# Patient Record
Sex: Female | Born: 1945 | Race: White | Hispanic: No | Marital: Married | Smoking: Never smoker
Health system: Southern US, Community
[De-identification: ages and names within clinical notes are randomized; demographics above are authoritative.]

## PROBLEM LIST (undated history)

## (undated) DIAGNOSIS — E785 Hyperlipidemia, unspecified: Secondary | ICD-10-CM

## (undated) HISTORY — DX: Hyperlipidemia, unspecified: E78.5

---

## 1991-05-03 HISTORY — PX: VAGINAL HYSTERECTOMY: SHX2639

## 1993-02-24 DIAGNOSIS — C4491 Basal cell carcinoma of skin, unspecified: Secondary | ICD-10-CM

## 1993-02-24 HISTORY — DX: Basal cell carcinoma of skin, unspecified: C44.91

## 1995-06-13 DIAGNOSIS — C4491 Basal cell carcinoma of skin, unspecified: Secondary | ICD-10-CM

## 1995-06-13 HISTORY — DX: Basal cell carcinoma of skin, unspecified: C44.91

## 2000-02-08 ENCOUNTER — Other Ambulatory Visit: Admission: RE | Admit: 2000-02-08 | Discharge: 2000-02-08 | Payer: Self-pay | Admitting: Gynecology

## 2004-07-20 DIAGNOSIS — C4492 Squamous cell carcinoma of skin, unspecified: Secondary | ICD-10-CM

## 2004-07-20 DIAGNOSIS — D229 Melanocytic nevi, unspecified: Secondary | ICD-10-CM

## 2004-07-20 DIAGNOSIS — D039 Melanoma in situ, unspecified: Secondary | ICD-10-CM

## 2004-07-20 HISTORY — DX: Squamous cell carcinoma of skin, unspecified: C44.92

## 2004-07-20 HISTORY — DX: Melanocytic nevi, unspecified: D22.9

## 2004-07-20 HISTORY — DX: Melanoma in situ, unspecified: D03.9

## 2004-09-02 ENCOUNTER — Ambulatory Visit (HOSPITAL_COMMUNITY): Admission: RE | Admit: 2004-09-02 | Discharge: 2004-09-02 | Payer: Self-pay | Admitting: Plastic Surgery

## 2004-09-02 ENCOUNTER — Ambulatory Visit (HOSPITAL_BASED_OUTPATIENT_CLINIC_OR_DEPARTMENT_OTHER): Admission: RE | Admit: 2004-09-02 | Discharge: 2004-09-02 | Payer: Self-pay | Admitting: Plastic Surgery

## 2005-12-31 ENCOUNTER — Emergency Department (HOSPITAL_COMMUNITY): Admission: EM | Admit: 2005-12-31 | Discharge: 2005-12-31 | Payer: Self-pay | Admitting: Emergency Medicine

## 2007-05-26 IMAGING — CR DG CHEST 2V
2 series · 2 of 2 positions shown · non-contrast
Comparison: none

CLINICAL DATA: Left chest pain. Cough.  Fever.
 [id number] VIEWS:
 Two views of the chest show patchy pneumonia within the lingula.  The remainder of the lungs are clear.  The heart is within normal limits and size.

[w chest pa]
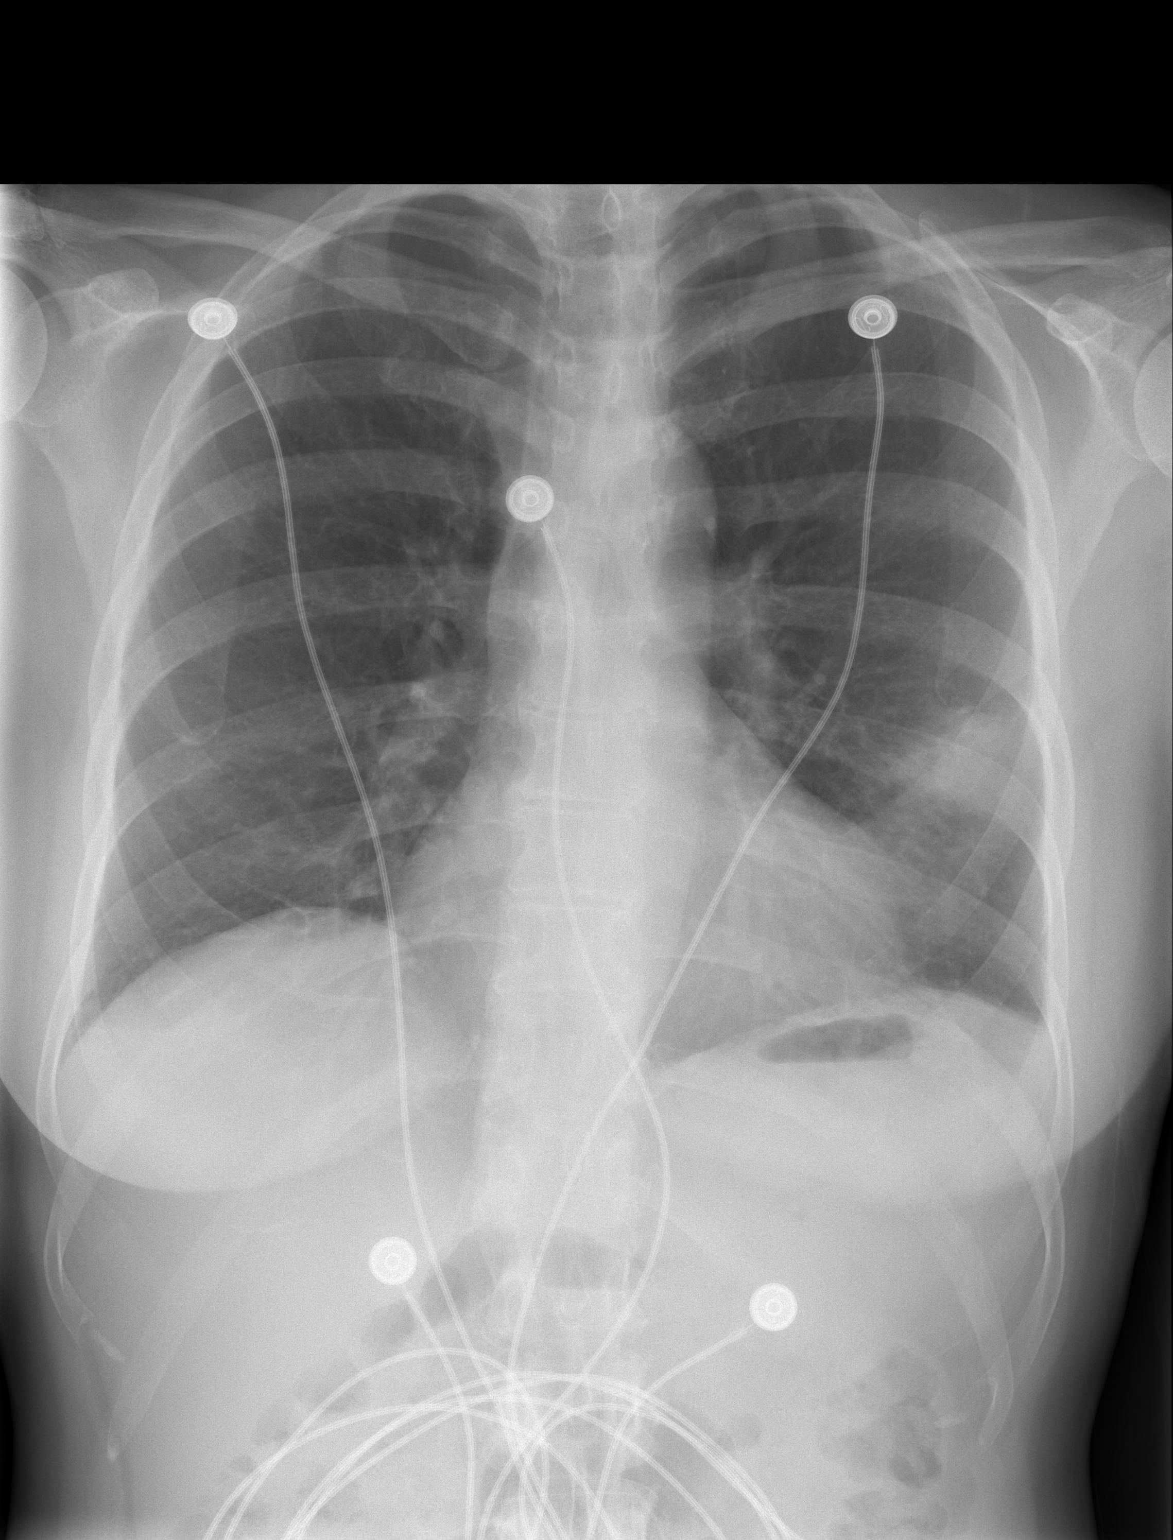

[w chest lat]
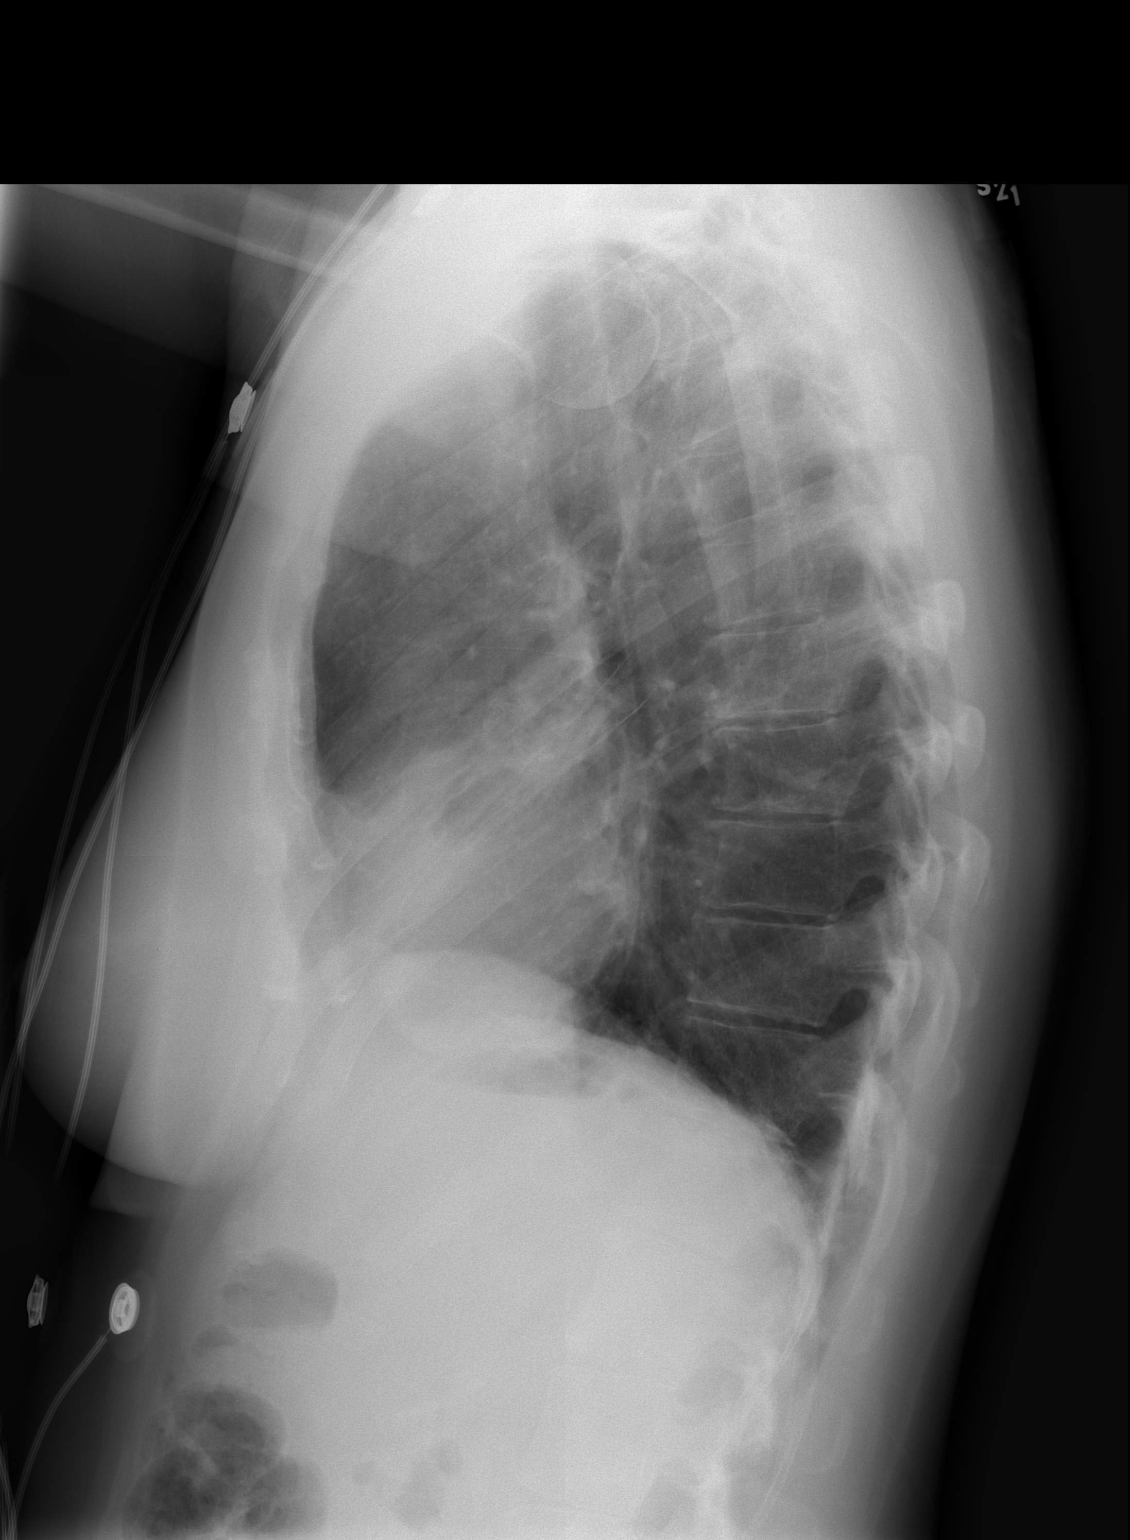

[2 of 2 positions shown; findings below may reference images not displayed]

IMPRESSION: Lingular pneumonia.

## 2015-10-16 ENCOUNTER — Other Ambulatory Visit: Payer: Self-pay | Admitting: Physician Assistant

## 2015-10-16 DIAGNOSIS — D234 Other benign neoplasm of skin of scalp and neck: Secondary | ICD-10-CM | POA: Diagnosis not present

## 2015-10-16 DIAGNOSIS — D485 Neoplasm of uncertain behavior of skin: Secondary | ICD-10-CM | POA: Diagnosis not present

## 2015-10-16 DIAGNOSIS — L570 Actinic keratosis: Secondary | ICD-10-CM | POA: Diagnosis not present

## 2015-10-16 DIAGNOSIS — D239 Other benign neoplasm of skin, unspecified: Secondary | ICD-10-CM | POA: Diagnosis not present

## 2015-10-30 DIAGNOSIS — F322 Major depressive disorder, single episode, severe without psychotic features: Secondary | ICD-10-CM | POA: Diagnosis not present

## 2015-10-30 DIAGNOSIS — Z79899 Other long term (current) drug therapy: Secondary | ICD-10-CM | POA: Diagnosis not present

## 2015-10-30 DIAGNOSIS — R7301 Impaired fasting glucose: Secondary | ICD-10-CM | POA: Diagnosis not present

## 2015-10-30 DIAGNOSIS — E7800 Pure hypercholesterolemia, unspecified: Secondary | ICD-10-CM | POA: Diagnosis not present

## 2015-10-30 DIAGNOSIS — D692 Other nonthrombocytopenic purpura: Secondary | ICD-10-CM | POA: Diagnosis not present

## 2015-10-30 DIAGNOSIS — Z0000 Encounter for general adult medical examination without abnormal findings: Secondary | ICD-10-CM | POA: Diagnosis not present

## 2015-10-30 DIAGNOSIS — E559 Vitamin D deficiency, unspecified: Secondary | ICD-10-CM | POA: Diagnosis not present

## 2015-10-30 DIAGNOSIS — R9431 Abnormal electrocardiogram [ECG] [EKG]: Secondary | ICD-10-CM | POA: Diagnosis not present

## 2015-12-21 ENCOUNTER — Telehealth: Payer: Self-pay | Admitting: Cardiovascular Disease

## 2015-12-21 NOTE — Telephone Encounter (Signed)
Received records from [facility] for appointment on 01/21/16 with Dr Oval Linsey.  Records given to [facility] (medical records) for Dr Blenda Mounts schedule on 01/21/16. lp

## 2016-01-21 ENCOUNTER — Ambulatory Visit: Payer: Self-pay | Admitting: Cardiovascular Disease

## 2016-01-29 ENCOUNTER — Ambulatory Visit (INDEPENDENT_AMBULATORY_CARE_PROVIDER_SITE_OTHER): Payer: PPO | Admitting: Cardiovascular Disease

## 2016-01-29 ENCOUNTER — Encounter: Payer: Self-pay | Admitting: Cardiovascular Disease

## 2016-01-29 VITALS — BP 126/72 | HR 57 | Ht 64.0 in | Wt 128.2 lb

## 2016-01-29 DIAGNOSIS — R011 Cardiac murmur, unspecified: Secondary | ICD-10-CM

## 2016-01-29 DIAGNOSIS — E7800 Pure hypercholesterolemia, unspecified: Secondary | ICD-10-CM

## 2016-01-29 NOTE — Patient Instructions (Signed)
Medication Instructions:  Your physician recommends that you continue on your current medications as directed. Please refer to the Current Medication list given to you today.  Labwork: none  Testing/Procedures: Your physician has requested that you have an echocardiogram. Echocardiography is a painless test that uses sound waves to create images of your heart. It provides your doctor with information about the size and shape of your heart and how well your heart's chambers and valves are working. This procedure takes approximately one hour. There are no restrictions for this procedure. [facility] [place] [address]  Follow-Up: AS NEEDED   Any Other Special Instructions Will Be Listed Below (If Applicable).     If you need a refill on your cardiac medications before your next appointment, please call your pharmacy.

## 2016-01-29 NOTE — Progress Notes (Signed)
Cardiology Office Note   Date:  01/29/2016   ID:  Wanda Jenkins, DOB 1946-01-30, MRN [id number]  PCP:  Lilian Coma, MD  Cardiologist:   Skeet Latch, MD   Chief Complaint  Patient presents with  . New Evaluation    refer byr Dr Stephanie Acre abnormal EKG, pt denied chest pain SOB and swelling in legs and feet      History of Present Illness: Wanda Jenkins is a 70 y.o. female with hyperlipidemia who presents for evaluation of an abnormal EKG.  Wanda Jenkins saw Dr. Jonathon Jordan on 11/16/15 and was noted to have diffuseT wave inversions. She been feeling well and denies any chest pain or shortness of breath. She exercises regularly doing yoga, the stairmaster, lifting weights, and stretching. She denies any exertional symptoms. She rarely notes palpitations that last one to 2 minutes. There is no associated shortness of breath or lightheadedness.  She reports having a stress test many years ago that she believes was also due to an abnromal EKG.  She ended up having a heart catheterization was did not reveal any significant CAD.  She notes that her father died of a heart attack in his early 5s.  Wanda Jenkins has not noted any lower extremity edema, orthopnea or PND.   History reviewed. No pertinent past medical history.  Past Surgical History:  Procedure Laterality Date  . VAGINAL HYSTERECTOMY  1993     Current Outpatient Prescriptions  Medication Sig Dispense Refill  . ALPRAZolam (XANAX) 0.5 MG tablet Take 0.5 mg by mouth at bedtime as needed for anxiety.    . cholecalciferol (VITAMIN D) 1000 units tablet Take 1,000 Units by mouth daily.    . Coenzyme Q10 (CO Q 10) 100 MG CAPS Take 1 capsule by mouth daily.    Marland Kitchen zolpidem (AMBIEN) 10 MG tablet Take 5 mg by mouth at bedtime as needed for sleep.     No current facility-administered medications for this visit.     Allergies:   Review of patient's allergies indicates no known allergies.    Social History:  The patient   reports that she has never smoked. She has never used smokeless tobacco. She reports that she drinks about 4.2 oz of alcohol per week .   Family History:  The patient's family history includes Asthma in her father; COPD in her sister; Colon cancer in her paternal grandfather; Heart attack in her maternal grandfather.    ROS:  Please see the history of present illness.   Otherwise, review of systems are positive for none.   All other systems are reviewed and negative.    PHYSICAL EXAM: VS:  BP 126/72   Pulse (!) 57   Ht 5\' 4"  (1.626 m)   Wt 128 lb 3.2 oz (58.2 kg)   BMI 22.01 kg/m  , BMI Body mass index is 22.01 kg/m. GENERAL:  Well appearing HEENT:  Pupils equal round and reactive, fundi not visualized, oral mucosa unremarkable NECK:  No jugular venous distention, waveform within normal limits, carotid upstroke brisk and symmetric, no bruits, no thyromegaly LYMPHATICS:  No cervical adenopathy LUNGS:  Clear to auscultation bilaterally HEART:  RRR.  PMI not displaced or sustained,S1 and S2 within normal limits, no S3, no S4, no clicks, no rubs, II/VI sysoloic murmur at the right and left upper sternal border ABD:  Flat, positive bowel sounds normal in frequency in pitch, no bruits, no rebound, no guarding, no midline pulsatile mass, no hepatomegaly, no splenomegaly EXT:  2 plus pulses throughout, no edema, no cyanosis no clubbing SKIN:  No rashes no nodules NEURO:  Cranial nerves II through XII grossly intact, motor grossly intact throughout PSYCH:  Cognitively intact, oriented to person place and time   EKG:  EKG is ordered today. The ekg ordered today demonstrates sinus bradycardia.  Rate 57 bpm.  Diffuse T wave inversions.    Recent Labs: No results found for requested labs within last 8760 hours.    Lipid Panel No results found for: CHOL, TRIG, HDL, CHOLHDL, VLDL, LDLCALC, LDLDIRECT    Wt Readings from Last 3 Encounters:  01/29/16 128 lb 3.2 oz (58.2 kg)       ASSESSMENT AND PLAN:  # Abnormal EKG: Wanda Jenkins has diffuse t wave inversions but is completely asymptomatic.  Also, she reports having an ischemia evaluation in the past for the same finding and had normal coronaries on cath.  Therefore, we will not repeat her ischemia evaluation at this time.  # Murmur: Systolic murmur was noted that could be either a flow murmur or mild AS.  Will get an echo.  # Hyperlipidemia: Managed by her PCP.  She is managing it with diet and exercise.  Current medicines are reviewed at length with the patient today.  The patient does not have concerns regarding medicines.  The following changes have been made:  no change  Labs/ tests ordered today include:  No orders of the defined types were placed in this encounter.    Disposition:   FU with Luisalberto Beegle C. Oval Linsey, MD, [facility] as needed    This note was written with the assistance of speech recognition software.  Please excuse any transcriptional errors.  Signed, Story Conti C. Oval Linsey, MD, [facility]  01/29/2016 11:22 AM    [place]

## 2016-02-04 ENCOUNTER — Encounter: Payer: Self-pay | Admitting: Cardiovascular Disease

## 2016-02-04 DIAGNOSIS — E785 Hyperlipidemia, unspecified: Secondary | ICD-10-CM

## 2016-02-04 HISTORY — DX: Hyperlipidemia, unspecified: E78.5

## 2016-02-12 ENCOUNTER — Ambulatory Visit (HOSPITAL_COMMUNITY): Payer: PPO | Attending: Cardiology

## 2016-02-12 ENCOUNTER — Other Ambulatory Visit: Payer: Self-pay

## 2016-02-12 DIAGNOSIS — R011 Cardiac murmur, unspecified: Secondary | ICD-10-CM | POA: Insufficient documentation

## 2016-02-12 DIAGNOSIS — I501 Left ventricular failure: Secondary | ICD-10-CM | POA: Diagnosis not present

## 2016-02-12 DIAGNOSIS — I351 Nonrheumatic aortic (valve) insufficiency: Secondary | ICD-10-CM | POA: Diagnosis not present

## 2016-02-12 DIAGNOSIS — I340 Nonrheumatic mitral (valve) insufficiency: Secondary | ICD-10-CM | POA: Insufficient documentation

## 2016-03-01 DIAGNOSIS — H2513 Age-related nuclear cataract, bilateral: Secondary | ICD-10-CM | POA: Diagnosis not present

## 2016-03-01 DIAGNOSIS — H52223 Regular astigmatism, bilateral: Secondary | ICD-10-CM | POA: Diagnosis not present

## 2016-03-01 DIAGNOSIS — H43393 Other vitreous opacities, bilateral: Secondary | ICD-10-CM | POA: Diagnosis not present

## 2016-03-01 DIAGNOSIS — H5203 Hypermetropia, bilateral: Secondary | ICD-10-CM | POA: Diagnosis not present

## 2016-03-30 ENCOUNTER — Ambulatory Visit (HOSPITAL_COMMUNITY)
Admission: EM | Admit: 2016-03-30 | Discharge: 2016-03-30 | Disposition: A | Discharge status: Home / Self Care | Payer: PPO

## 2016-04-01 DIAGNOSIS — J209 Acute bronchitis, unspecified: Secondary | ICD-10-CM | POA: Diagnosis not present

## 2016-11-28 DIAGNOSIS — Z0000 Encounter for general adult medical examination without abnormal findings: Secondary | ICD-10-CM | POA: Diagnosis not present

## 2016-11-28 DIAGNOSIS — R7301 Impaired fasting glucose: Secondary | ICD-10-CM | POA: Diagnosis not present

## 2016-11-28 DIAGNOSIS — E559 Vitamin D deficiency, unspecified: Secondary | ICD-10-CM | POA: Diagnosis not present

## 2016-11-28 DIAGNOSIS — E7800 Pure hypercholesterolemia, unspecified: Secondary | ICD-10-CM | POA: Diagnosis not present

## 2016-11-28 DIAGNOSIS — Z79899 Other long term (current) drug therapy: Secondary | ICD-10-CM | POA: Diagnosis not present

## 2016-11-28 DIAGNOSIS — F322 Major depressive disorder, single episode, severe without psychotic features: Secondary | ICD-10-CM | POA: Diagnosis not present

## 2016-11-28 DIAGNOSIS — G479 Sleep disorder, unspecified: Secondary | ICD-10-CM | POA: Diagnosis not present

## 2017-03-06 DIAGNOSIS — H52223 Regular astigmatism, bilateral: Secondary | ICD-10-CM | POA: Diagnosis not present

## 2017-03-06 DIAGNOSIS — H5203 Hypermetropia, bilateral: Secondary | ICD-10-CM | POA: Diagnosis not present

## 2017-03-06 DIAGNOSIS — H40013 Open angle with borderline findings, low risk, bilateral: Secondary | ICD-10-CM | POA: Diagnosis not present

## 2017-03-06 DIAGNOSIS — H40053 Ocular hypertension, bilateral: Secondary | ICD-10-CM | POA: Diagnosis not present

## 2017-03-06 DIAGNOSIS — H524 Presbyopia: Secondary | ICD-10-CM | POA: Diagnosis not present

## 2017-07-27 DIAGNOSIS — J209 Acute bronchitis, unspecified: Secondary | ICD-10-CM | POA: Diagnosis not present

## 2017-08-15 DIAGNOSIS — J329 Chronic sinusitis, unspecified: Secondary | ICD-10-CM | POA: Diagnosis not present

## 2017-11-22 DIAGNOSIS — L820 Inflamed seborrheic keratosis: Secondary | ICD-10-CM | POA: Diagnosis not present

## 2017-11-22 DIAGNOSIS — D229 Melanocytic nevi, unspecified: Secondary | ICD-10-CM | POA: Diagnosis not present

## 2017-11-22 DIAGNOSIS — L570 Actinic keratosis: Secondary | ICD-10-CM | POA: Diagnosis not present

## 2018-01-16 DIAGNOSIS — R011 Cardiac murmur, unspecified: Secondary | ICD-10-CM | POA: Diagnosis not present

## 2018-01-16 DIAGNOSIS — D692 Other nonthrombocytopenic purpura: Secondary | ICD-10-CM | POA: Diagnosis not present

## 2018-01-16 DIAGNOSIS — Z0000 Encounter for general adult medical examination without abnormal findings: Secondary | ICD-10-CM | POA: Diagnosis not present

## 2018-01-16 DIAGNOSIS — E7800 Pure hypercholesterolemia, unspecified: Secondary | ICD-10-CM | POA: Diagnosis not present

## 2018-01-16 DIAGNOSIS — F322 Major depressive disorder, single episode, severe without psychotic features: Secondary | ICD-10-CM | POA: Diagnosis not present

## 2018-01-16 DIAGNOSIS — R7301 Impaired fasting glucose: Secondary | ICD-10-CM | POA: Diagnosis not present

## 2018-01-16 DIAGNOSIS — Z1211 Encounter for screening for malignant neoplasm of colon: Secondary | ICD-10-CM | POA: Diagnosis not present

## 2018-01-16 DIAGNOSIS — G479 Sleep disorder, unspecified: Secondary | ICD-10-CM | POA: Diagnosis not present

## 2018-01-16 DIAGNOSIS — E559 Vitamin D deficiency, unspecified: Secondary | ICD-10-CM | POA: Diagnosis not present

## 2018-01-16 DIAGNOSIS — Z79899 Other long term (current) drug therapy: Secondary | ICD-10-CM | POA: Diagnosis not present

## 2018-04-06 DIAGNOSIS — H40053 Ocular hypertension, bilateral: Secondary | ICD-10-CM | POA: Diagnosis not present

## 2018-04-06 DIAGNOSIS — H52223 Regular astigmatism, bilateral: Secondary | ICD-10-CM | POA: Diagnosis not present

## 2018-04-06 DIAGNOSIS — H40013 Open angle with borderline findings, low risk, bilateral: Secondary | ICD-10-CM | POA: Diagnosis not present

## 2018-04-06 DIAGNOSIS — H5203 Hypermetropia, bilateral: Secondary | ICD-10-CM | POA: Diagnosis not present

## 2018-04-06 DIAGNOSIS — H40033 Anatomical narrow angle, bilateral: Secondary | ICD-10-CM | POA: Diagnosis not present

## 2018-04-06 DIAGNOSIS — H524 Presbyopia: Secondary | ICD-10-CM | POA: Diagnosis not present

## 2018-04-06 DIAGNOSIS — H25813 Combined forms of age-related cataract, bilateral: Secondary | ICD-10-CM | POA: Diagnosis not present

## 2018-10-05 DIAGNOSIS — G479 Sleep disorder, unspecified: Secondary | ICD-10-CM | POA: Diagnosis not present

## 2019-03-08 DIAGNOSIS — Z79899 Other long term (current) drug therapy: Secondary | ICD-10-CM | POA: Diagnosis not present

## 2019-03-08 DIAGNOSIS — E559 Vitamin D deficiency, unspecified: Secondary | ICD-10-CM | POA: Diagnosis not present

## 2019-03-08 DIAGNOSIS — R7301 Impaired fasting glucose: Secondary | ICD-10-CM | POA: Diagnosis not present

## 2019-03-08 DIAGNOSIS — E7800 Pure hypercholesterolemia, unspecified: Secondary | ICD-10-CM | POA: Diagnosis not present

## 2019-03-14 DIAGNOSIS — F330 Major depressive disorder, recurrent, mild: Secondary | ICD-10-CM | POA: Diagnosis not present

## 2019-03-14 DIAGNOSIS — D692 Other nonthrombocytopenic purpura: Secondary | ICD-10-CM | POA: Diagnosis not present

## 2019-03-14 DIAGNOSIS — Z0000 Encounter for general adult medical examination without abnormal findings: Secondary | ICD-10-CM | POA: Diagnosis not present

## 2019-03-14 DIAGNOSIS — R7301 Impaired fasting glucose: Secondary | ICD-10-CM | POA: Diagnosis not present

## 2019-03-14 DIAGNOSIS — E7800 Pure hypercholesterolemia, unspecified: Secondary | ICD-10-CM | POA: Diagnosis not present

## 2019-03-14 DIAGNOSIS — E559 Vitamin D deficiency, unspecified: Secondary | ICD-10-CM | POA: Diagnosis not present

## 2019-03-14 DIAGNOSIS — Z79899 Other long term (current) drug therapy: Secondary | ICD-10-CM | POA: Diagnosis not present

## 2019-03-14 DIAGNOSIS — Z1211 Encounter for screening for malignant neoplasm of colon: Secondary | ICD-10-CM | POA: Diagnosis not present

## 2019-03-14 DIAGNOSIS — R011 Cardiac murmur, unspecified: Secondary | ICD-10-CM | POA: Diagnosis not present

## 2019-03-19 DIAGNOSIS — Z1211 Encounter for screening for malignant neoplasm of colon: Secondary | ICD-10-CM | POA: Diagnosis not present

## 2019-05-07 DIAGNOSIS — H40053 Ocular hypertension, bilateral: Secondary | ICD-10-CM | POA: Diagnosis not present

## 2019-05-07 DIAGNOSIS — H524 Presbyopia: Secondary | ICD-10-CM | POA: Diagnosis not present

## 2019-05-07 DIAGNOSIS — H25819 Combined forms of age-related cataract, unspecified eye: Secondary | ICD-10-CM | POA: Diagnosis not present

## 2019-05-07 DIAGNOSIS — H5203 Hypermetropia, bilateral: Secondary | ICD-10-CM | POA: Diagnosis not present

## 2019-05-07 DIAGNOSIS — H11423 Conjunctival edema, bilateral: Secondary | ICD-10-CM | POA: Diagnosis not present

## 2019-05-07 DIAGNOSIS — H52223 Regular astigmatism, bilateral: Secondary | ICD-10-CM | POA: Diagnosis not present

## 2019-07-09 DIAGNOSIS — H40053 Ocular hypertension, bilateral: Secondary | ICD-10-CM | POA: Diagnosis not present

## 2019-07-09 DIAGNOSIS — H40052 Ocular hypertension, left eye: Secondary | ICD-10-CM | POA: Diagnosis not present

## 2019-07-09 DIAGNOSIS — H40013 Open angle with borderline findings, low risk, bilateral: Secondary | ICD-10-CM | POA: Diagnosis not present

## 2019-07-09 DIAGNOSIS — H40051 Ocular hypertension, right eye: Secondary | ICD-10-CM | POA: Diagnosis not present

## 2019-08-29 ENCOUNTER — Ambulatory Visit: Payer: PPO | Admitting: Physician Assistant

## 2019-08-29 ENCOUNTER — Other Ambulatory Visit: Payer: Self-pay

## 2019-08-29 ENCOUNTER — Encounter: Payer: Self-pay | Admitting: Physician Assistant

## 2019-08-29 DIAGNOSIS — D489 Neoplasm of uncertain behavior, unspecified: Secondary | ICD-10-CM

## 2019-08-29 DIAGNOSIS — D0430 Carcinoma in situ of skin of unspecified part of face: Secondary | ICD-10-CM

## 2019-08-29 DIAGNOSIS — D485 Neoplasm of uncertain behavior of skin: Secondary | ICD-10-CM

## 2019-08-29 DIAGNOSIS — Z1283 Encounter for screening for malignant neoplasm of skin: Secondary | ICD-10-CM

## 2019-08-29 DIAGNOSIS — L820 Inflamed seborrheic keratosis: Secondary | ICD-10-CM | POA: Diagnosis not present

## 2019-08-29 DIAGNOSIS — C4492 Squamous cell carcinoma of skin, unspecified: Secondary | ICD-10-CM

## 2019-08-29 DIAGNOSIS — L570 Actinic keratosis: Secondary | ICD-10-CM | POA: Diagnosis not present

## 2019-08-29 DIAGNOSIS — D0439 Carcinoma in situ of skin of other parts of face: Secondary | ICD-10-CM

## 2019-08-29 DIAGNOSIS — D0462 Carcinoma in situ of skin of left upper limb, including shoulder: Secondary | ICD-10-CM | POA: Diagnosis not present

## 2019-08-29 DIAGNOSIS — D492 Neoplasm of unspecified behavior of bone, soft tissue, and skin: Secondary | ICD-10-CM

## 2019-08-29 DIAGNOSIS — D048 Carcinoma in situ of skin of other sites: Secondary | ICD-10-CM

## 2019-08-29 HISTORY — DX: Squamous cell carcinoma of skin, unspecified: C44.92

## 2019-08-29 NOTE — Patient Instructions (Signed)

## 2019-08-29 NOTE — Progress Notes (Addendum)
Follow-Up Visit   Subjective  SERINITY STAR is a 74 y.o. female who presents for the following: Annual Exam (spot under left arm x 3 months no bleeding. Spot on right forehead x years no symptoms).  Location: right forehead, left hand Duration: months Quality: pink scale Associated Signs/Symptoms: tender Modifying Factors: persistent Severity: stable    The following portions of the chart were reviewed this encounter and updated as appropriate: Tobacco  Allergies  Meds  Problems  Med Hx  Surg Hx  Fam Hx      Objective  Well appearing patient in no apparent distress; mood and affect are within normal limits.  A full examination was performed including scalp, head, eyes, ears, nose, lips, neck, chest, axillae, abdomen, back, buttocks, bilateral upper extremities, bilateral lower extremities, hands, feet, fingers, toes, fingernails, and toenails. All findings within normal limits unless otherwise noted below.  Objective  Right Hand - Posterior: Erythematous patches with gritty scale.  Objective  head to toe: No DN noted at the time of the visit  Objective  Right Forehead: Scaly erythematous macule      Objective  Left Hand - Posterior: Hyperkeratotic scale with pink base      Objective  Left Axilla: Erythematous stuck-on, waxy papule or plaque.      Assessment & Plan  AK (actinic keratosis) Right Hand - Posterior  Destruction of lesion - Right Hand - Posterior Complexity: simple   Destruction method: cryotherapy   Informed consent: discussed and consent obtained   Timeout:  patient name, date of birth, surgical site, and procedure verified Lesion destroyed using liquid nitrogen: Yes   Cryotherapy cycles:  1 Outcome: patient tolerated procedure well with no complications   Post-procedure details: wound care instructions given    Screening exam for skin cancer head to toe  Yearly skin exams  Carcinoma in situ of skin of face, unspecified  location Right Forehead  Skin / nail biopsy Type of biopsy: tangential   Informed consent: discussed and consent obtained   Timeout: patient name, date of birth, surgical site, and procedure verified   Anesthesia: the lesion was anesthetized in a standard fashion   Anesthetic:  1% lidocaine w/ epinephrine 1-100,000 local infiltration Instrument used: flexible razor blade   Hemostasis achieved with: aluminum chloride and electrodesiccation   Outcome: patient tolerated procedure well   Post-procedure details: wound care instructions given    Specimen 2 - Surgical pathology Differential Diagnosis:bcc, scc Check Margins: No  Carcinoma in situ of other site of skin Left Hand - Posterior  Skin / nail biopsy Type of biopsy: tangential   Informed consent: discussed and consent obtained   Timeout: patient name, date of birth, surgical site, and procedure verified   Anesthesia: the lesion was anesthetized in a standard fashion   Anesthetic:  1% lidocaine w/ epinephrine 1-100,000 local infiltration Instrument used: flexible razor blade   Hemostasis achieved with: aluminum chloride and electrodesiccation   Outcome: patient tolerated procedure well   Post-procedure details: wound care instructions given    Specimen 3 - Surgical pathology Differential Diagnosis: scc vs bcc Check Margins: No Hyperkeratotic scale with pink base  Neoplasm of skin Left Axilla  Epidermal / dermal shaving  Lesion diameter (cm):  1.2 Informed consent: discussed and consent obtained   Timeout: patient name, date of birth, surgical site, and procedure verified   Procedure prep:  Patient was prepped and draped in usual sterile fashion Prep type:  Chlorhexidine Anesthesia: the lesion was anesthetized in a  standard fashion   Anesthetic:  1% lidocaine w/ epinephrine 1-100,000 local infiltration Instrument used: flexible razor blade   Outcome: patient tolerated procedure well   Post-procedure details: wound  care instructions given    Specimen 1 - Surgical pathology Differential Diagnosis: sk nevus tag Check Margins: No Erythematous stuck-on, waxy papule or plaque.  I have reviewed the above documentation for accuracy and completeness and I agree with the above.  Robyne Askew PA-C

## 2019-09-02 ENCOUNTER — Encounter: Payer: Self-pay | Admitting: *Deleted

## 2019-09-02 ENCOUNTER — Telehealth: Payer: Self-pay | Admitting: *Deleted

## 2019-09-02 NOTE — Telephone Encounter (Signed)
-----   Message from Warren Danes, [place] sent at 09/02/2019  3:36 PM EDT ----- 30 min surgery #2,3

## 2019-09-02 NOTE — Telephone Encounter (Signed)
Pathology results to pateint.  Patient will call back for 30 minute surgery with [facility].

## 2019-09-03 ENCOUNTER — Telehealth: Payer: Self-pay | Admitting: Physician Assistant

## 2019-09-03 NOTE — Telephone Encounter (Signed)
Patient calling back to schedule surgery appointment.

## 2019-09-03 NOTE — Telephone Encounter (Signed)
Patient calling for pathology results from last week.  Wanda Jenkins said she was driving when she was talking to someone yesterday and did not get all the information. Chart # [id number]

## 2019-09-11 DIAGNOSIS — F330 Major depressive disorder, recurrent, mild: Secondary | ICD-10-CM | POA: Diagnosis not present

## 2019-11-21 ENCOUNTER — Ambulatory Visit: Payer: PPO | Admitting: Physician Assistant

## 2019-12-19 ENCOUNTER — Encounter: Payer: Self-pay | Admitting: Physician Assistant

## 2019-12-19 ENCOUNTER — Ambulatory Visit (INDEPENDENT_AMBULATORY_CARE_PROVIDER_SITE_OTHER): Payer: PPO | Admitting: Physician Assistant

## 2019-12-19 ENCOUNTER — Other Ambulatory Visit: Payer: Self-pay

## 2019-12-19 DIAGNOSIS — D049 Carcinoma in situ of skin, unspecified: Secondary | ICD-10-CM

## 2019-12-19 DIAGNOSIS — D0462 Carcinoma in situ of skin of left upper limb, including shoulder: Secondary | ICD-10-CM | POA: Diagnosis not present

## 2019-12-19 DIAGNOSIS — D0430 Carcinoma in situ of skin of unspecified part of face: Secondary | ICD-10-CM

## 2019-12-19 DIAGNOSIS — L570 Actinic keratosis: Secondary | ICD-10-CM

## 2019-12-19 DIAGNOSIS — D0439 Carcinoma in situ of skin of other parts of face: Secondary | ICD-10-CM

## 2019-12-19 NOTE — Patient Instructions (Signed)

## 2019-12-19 NOTE — Progress Notes (Addendum)
   Follow-Up Visit   Subjective  Wanda Jenkins is a 74 y.o. female who presents for the following: Procedure.   The following portions of the chart were reviewed this encounter and updated as appropriate: Tobacco  Allergies  Meds  Problems  Med Hx  Surg Hx  Fam Hx      Objective  Well appearing patient in no apparent distress; mood and affect are within normal limits.  A focused examination was performed including face and arms. Relevant physical exam findings are noted in the Assessment and Plan.  Objective  Left Dorsal Hand, Right Dorsal Hand (3), right clavicle: Erythematous patches with gritty scale.  Objective  Left Dorsal Hand: Scaly pink papule or plaque.  [phone] previous bx  Objective  Right Forehead: Scaly pink papule or plaque.  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 Left Dorsal Hand  Destruction of lesion Complexity: simple   Destruction method: electrodesiccation and curettage   Informed consent: discussed and consent obtained   Timeout:  patient name, date of birth, surgical site, and procedure verified Anesthesia: the lesion was anesthetized in a standard fashion   Anesthetic:  1% lidocaine w/ epinephrine 1-100,000 local infiltration Curettage performed in three different directions: Yes   Electrodesiccation performed over the curetted area: Yes   Curettage cycles:  3 Lesion length (cm):  0 Lesion width (cm):   1.5 Margin per side (cm):  0 Final wound size (cm):  1.5 Hemostasis achieved with:  ferric subsulfate Outcome: patient tolerated procedure well with no complications   Additional details:  Wound innoculated with 5 fluorouracil solution.  Carcinoma in situ of skin of face, unspecified location Right Forehead  Destruction of lesion Complexity: simple   Destruction method: electrodesiccation and curettage   Informed consent: discussed and consent obtained   Timeout:  patient name, date of birth, surgical site, and procedure verified Anesthesia: the lesion was anesthetized in a standard fashion   Anesthetic:  1% lidocaine w/ epinephrine 1-100,000 local infiltration Curettage performed in three different directions: Yes   Electrodesiccation performed over the curetted area: Yes   Curettage cycles:  3 Lesion length (cm):  0 Margin per side (cm):  0 Final wound size (cm):  1.2 Hemostasis achieved with:  ferric subsulfate Outcome: patient tolerated procedure well with no complications   Additional details:  Wound innoculated with 5 fluorouracil solution.   I, Destaney Sarkis, PA-C, have reviewed all documentation's for this visit.  The documentation on 12/30/19 for the exam, diagnosis, procedures and orders are all accurate and complete.

## 2019-12-27 DIAGNOSIS — H40052 Ocular hypertension, left eye: Secondary | ICD-10-CM | POA: Diagnosis not present

## 2019-12-27 DIAGNOSIS — H524 Presbyopia: Secondary | ICD-10-CM | POA: Diagnosis not present

## 2019-12-27 DIAGNOSIS — H5203 Hypermetropia, bilateral: Secondary | ICD-10-CM | POA: Diagnosis not present

## 2019-12-27 DIAGNOSIS — H40003 Preglaucoma, unspecified, bilateral: Secondary | ICD-10-CM | POA: Diagnosis not present

## 2019-12-27 DIAGNOSIS — H52223 Regular astigmatism, bilateral: Secondary | ICD-10-CM | POA: Diagnosis not present

## 2020-03-16 DIAGNOSIS — E559 Vitamin D deficiency, unspecified: Secondary | ICD-10-CM | POA: Diagnosis not present

## 2020-03-16 DIAGNOSIS — Z79899 Other long term (current) drug therapy: Secondary | ICD-10-CM | POA: Diagnosis not present

## 2020-03-16 DIAGNOSIS — E7800 Pure hypercholesterolemia, unspecified: Secondary | ICD-10-CM | POA: Diagnosis not present

## 2020-03-16 DIAGNOSIS — Z23 Encounter for immunization: Secondary | ICD-10-CM | POA: Diagnosis not present

## 2020-03-16 DIAGNOSIS — R7303 Prediabetes: Secondary | ICD-10-CM | POA: Diagnosis not present

## 2020-04-07 DIAGNOSIS — R7303 Prediabetes: Secondary | ICD-10-CM | POA: Diagnosis not present

## 2020-04-07 DIAGNOSIS — D692 Other nonthrombocytopenic purpura: Secondary | ICD-10-CM | POA: Diagnosis not present

## 2020-04-07 DIAGNOSIS — Z1211 Encounter for screening for malignant neoplasm of colon: Secondary | ICD-10-CM | POA: Diagnosis not present

## 2020-04-07 DIAGNOSIS — Z0000 Encounter for general adult medical examination without abnormal findings: Secondary | ICD-10-CM | POA: Diagnosis not present

## 2020-04-07 DIAGNOSIS — R011 Cardiac murmur, unspecified: Secondary | ICD-10-CM | POA: Diagnosis not present

## 2020-04-07 DIAGNOSIS — E559 Vitamin D deficiency, unspecified: Secondary | ICD-10-CM | POA: Diagnosis not present

## 2020-04-07 DIAGNOSIS — R7301 Impaired fasting glucose: Secondary | ICD-10-CM | POA: Diagnosis not present

## 2020-04-07 DIAGNOSIS — Z79899 Other long term (current) drug therapy: Secondary | ICD-10-CM | POA: Diagnosis not present

## 2020-04-07 DIAGNOSIS — G479 Sleep disorder, unspecified: Secondary | ICD-10-CM | POA: Diagnosis not present

## 2020-04-07 DIAGNOSIS — E7800 Pure hypercholesterolemia, unspecified: Secondary | ICD-10-CM | POA: Diagnosis not present

## 2020-04-07 DIAGNOSIS — F330 Major depressive disorder, recurrent, mild: Secondary | ICD-10-CM | POA: Diagnosis not present

## 2020-04-08 DIAGNOSIS — Z1211 Encounter for screening for malignant neoplasm of colon: Secondary | ICD-10-CM | POA: Diagnosis not present

## 2020-06-23 ENCOUNTER — Ambulatory Visit: Payer: PPO | Admitting: Physician Assistant

## 2020-10-22 DIAGNOSIS — G479 Sleep disorder, unspecified: Secondary | ICD-10-CM | POA: Diagnosis not present

## 2020-11-30 DIAGNOSIS — H40052 Ocular hypertension, left eye: Secondary | ICD-10-CM | POA: Diagnosis not present

## 2020-11-30 DIAGNOSIS — H524 Presbyopia: Secondary | ICD-10-CM | POA: Diagnosis not present

## 2020-11-30 DIAGNOSIS — H5340 Unspecified visual field defects: Secondary | ICD-10-CM | POA: Diagnosis not present

## 2020-11-30 DIAGNOSIS — H40012 Open angle with borderline findings, low risk, left eye: Secondary | ICD-10-CM | POA: Diagnosis not present

## 2020-11-30 DIAGNOSIS — H40053 Ocular hypertension, bilateral: Secondary | ICD-10-CM | POA: Diagnosis not present

## 2020-11-30 DIAGNOSIS — H40013 Open angle with borderline findings, low risk, bilateral: Secondary | ICD-10-CM | POA: Diagnosis not present

## 2020-11-30 DIAGNOSIS — H40051 Ocular hypertension, right eye: Secondary | ICD-10-CM | POA: Diagnosis not present

## 2020-11-30 DIAGNOSIS — H52223 Regular astigmatism, bilateral: Secondary | ICD-10-CM | POA: Diagnosis not present

## 2020-11-30 DIAGNOSIS — H5203 Hypermetropia, bilateral: Secondary | ICD-10-CM | POA: Diagnosis not present

## 2021-03-15 ENCOUNTER — Ambulatory Visit: Payer: PPO | Admitting: Physician Assistant

## 2021-03-15 ENCOUNTER — Other Ambulatory Visit: Payer: Self-pay

## 2021-03-15 DIAGNOSIS — L570 Actinic keratosis: Secondary | ICD-10-CM

## 2021-03-15 DIAGNOSIS — D485 Neoplasm of uncertain behavior of skin: Secondary | ICD-10-CM

## 2021-03-15 DIAGNOSIS — D044 Carcinoma in situ of skin of scalp and neck: Secondary | ICD-10-CM

## 2021-03-15 MED ORDER — TOLAK 4 % EX CREA: TOPICAL_CREAM | CUTANEOUS | 0 refills | Status: AC

## 2021-03-15 NOTE — Patient Instructions (Signed)

## 2021-03-16 ENCOUNTER — Encounter: Payer: Self-pay | Admitting: Physician Assistant

## 2021-03-16 NOTE — Progress Notes (Addendum)
   Follow-Up Visit   Subjective  Wanda Jenkins is a 75 y.o. female who presents for the following: Skin Problem (Scap lesions  per patient x  6weeks ).   The following portions of the chart were reviewed this encounter and updated as appropriate:  Tobacco  Allergies  Meds  Problems  Med Hx  Surg Hx  Fam Hx      Objective  Well appearing patient in no apparent distress; mood and affect are within normal limits.  A full examination was performed including scalp, head, eyes, ears, nose, lips, neck, chest, axillae, abdomen, back, buttocks, bilateral upper extremities, bilateral lower extremities, hands, feet, fingers, toes, fingernails, and toenails. All findings within normal limits unless otherwise noted below.  Mid Parietal Scalp Pearly papule with telangectasia.      left hand Erythematous patches with gritty scale.  Assessment & Plan  Neoplasm of uncertain behavior of skin Mid Parietal Scalp  Skin / nail biopsy Type of biopsy: tangential   Informed consent: discussed and consent obtained   Timeout: patient name, date of birth, surgical site, and procedure verified   Anesthesia: the lesion was anesthetized in a standard fashion   Anesthetic:  1% lidocaine w/ epinephrine 1-100,000 local infiltration Instrument used: flexible razor blade   Hemostasis achieved with: aluminum chloride and electrodesiccation   Outcome: patient tolerated procedure well   Post-procedure details: wound care instructions given    Specimen 1 - Surgical pathology Differential Diagnosis: bcc possible mohs  Check Margins: No  Patient aware possible mohs when pathology comes back. Also the office will be closed next week and referral with be completed when [facility] reviewed the pathology and patient alos aware mohs is running 2-4 weeks behind.  AK (actinic keratosis) left hand  Destruction of lesion - left hand Complexity: simple   Destruction method: cryotherapy   Informed consent:  discussed and consent obtained   Timeout:  patient name, date of birth, surgical site, and procedure verified Lesion destroyed using liquid nitrogen: Yes   Cryotherapy cycles:  3 Outcome: patient tolerated procedure well with no complications   Post-procedure details: wound care instructions given    Fluorouracil (TOLAK) 4 % CREA - left hand Apply nightly for 2 weeks   I, Siomara Burkel, PA-C, have reviewed all documentation's for this visit.  The documentation on 04/06/21 for the exam, diagnosis, procedures and orders are all accurate and complete.

## 2021-04-06 ENCOUNTER — Telehealth: Payer: Self-pay

## 2021-04-06 NOTE — Telephone Encounter (Signed)
Path to patient surgery already made

## 2021-04-06 NOTE — Telephone Encounter (Signed)
-----   Message from Warren Danes, [place] sent at 04/06/2021 10:25 AM EST ----- 30

## 2021-07-07 ENCOUNTER — Other Ambulatory Visit: Payer: Self-pay

## 2021-07-07 ENCOUNTER — Encounter: Payer: Self-pay | Admitting: Physician Assistant

## 2021-07-07 ENCOUNTER — Ambulatory Visit: Payer: PPO | Admitting: Physician Assistant

## 2021-07-07 DIAGNOSIS — L659 Nonscarring hair loss, unspecified: Secondary | ICD-10-CM

## 2021-07-07 DIAGNOSIS — D044 Carcinoma in situ of skin of scalp and neck: Secondary | ICD-10-CM

## 2021-07-07 NOTE — Progress Notes (Signed)
? ?  Follow-Up Visit ?  ?Subjective  ?Wanda Jenkins is a 76 y.o. female who presents for the following: Follow-up (Follow up top scalp CIS was not treated at the time of the biopsy. She is also having hairloss/shedding in the crown. Her thyroid was normal as tested by PCP. Marland Kitchen). ? ? ?The following portions of the chart were reviewed this encounter and updated as appropriate:  Tobacco  Allergies  Meds  Problems  Med Hx  Surg Hx  Fam Hx   ?  ? ?Objective  ?Well appearing patient in no apparent distress; mood and affect are within normal limits. ? ?A full examination was performed including scalp, head, eyes, ears, nose, lips, neck, chest, axillae, abdomen, back, buttocks, bilateral upper extremities, bilateral lower extremities, hands, feet, fingers, toes, fingernails, and toenails. All findings within normal limits unless otherwise noted below. ? ?Mid Parietal Scalp ?Pink scale  ? ?Mid Frontal Scalp ?Diffuse loss ? ? 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 ?Destruction of lesion ?Complexity: simple   ?Destruction method: cryotherapy   ?Informed consent: discussed and consent obtained   ?Timeout:  patient name, date of birth, surgical site, and procedure verified ?Lesion destroyed using liquid nitrogen: Yes   ?Region frozen until ice ball extended beyond lesion: Yes   ?Cryotherapy cycles:  3 ?Final wound size (cm):  1.5 ?Outcome: patient tolerated procedure well with no complications   ? ?Alopecia ?Mid Frontal Scalp ? ?Over the counter 5% rogaine ? ? ? ?I, Mikell Camp, PA-C, have reviewed all documentation's for this visit.  The documentation on 07/07/21 for the exam, diagnosis, procedures and orders are all accurate and complete. ?

## 2021-07-07 NOTE — Patient Instructions (Signed)

## 2021-08-12 DIAGNOSIS — H524 Presbyopia: Secondary | ICD-10-CM | POA: Diagnosis not present

## 2021-08-12 DIAGNOSIS — H5203 Hypermetropia, bilateral: Secondary | ICD-10-CM | POA: Diagnosis not present

## 2021-08-12 DIAGNOSIS — H52223 Regular astigmatism, bilateral: Secondary | ICD-10-CM | POA: Diagnosis not present

## 2021-08-12 DIAGNOSIS — H401111 Primary open-angle glaucoma, right eye, mild stage: Secondary | ICD-10-CM | POA: Diagnosis not present

## 2021-08-12 DIAGNOSIS — H401121 Primary open-angle glaucoma, left eye, mild stage: Secondary | ICD-10-CM | POA: Diagnosis not present

## 2022-03-08 ENCOUNTER — Ambulatory Visit: Payer: PPO | Admitting: Physician Assistant

## 2022-03-15 DIAGNOSIS — H401131 Primary open-angle glaucoma, bilateral, mild stage: Secondary | ICD-10-CM | POA: Diagnosis not present

## 2022-05-19 DIAGNOSIS — M25521 Pain in right elbow: Secondary | ICD-10-CM | POA: Diagnosis not present

## 2022-05-19 DIAGNOSIS — M7711 Lateral epicondylitis, right elbow: Secondary | ICD-10-CM | POA: Diagnosis not present

## 2022-05-23 DIAGNOSIS — M7711 Lateral epicondylitis, right elbow: Secondary | ICD-10-CM | POA: Diagnosis not present

## 2022-05-23 DIAGNOSIS — M25521 Pain in right elbow: Secondary | ICD-10-CM | POA: Diagnosis not present

## 2022-05-25 DIAGNOSIS — M25521 Pain in right elbow: Secondary | ICD-10-CM | POA: Diagnosis not present

## 2022-05-25 DIAGNOSIS — M7711 Lateral epicondylitis, right elbow: Secondary | ICD-10-CM | POA: Diagnosis not present

## 2022-06-30 DIAGNOSIS — H5340 Unspecified visual field defects: Secondary | ICD-10-CM | POA: Diagnosis not present

## 2022-06-30 DIAGNOSIS — H5203 Hypermetropia, bilateral: Secondary | ICD-10-CM | POA: Diagnosis not present

## 2022-06-30 DIAGNOSIS — H524 Presbyopia: Secondary | ICD-10-CM | POA: Diagnosis not present

## 2022-06-30 DIAGNOSIS — H40012 Open angle with borderline findings, low risk, left eye: Secondary | ICD-10-CM | POA: Diagnosis not present

## 2022-06-30 DIAGNOSIS — H401121 Primary open-angle glaucoma, left eye, mild stage: Secondary | ICD-10-CM | POA: Diagnosis not present

## 2022-06-30 DIAGNOSIS — H52223 Regular astigmatism, bilateral: Secondary | ICD-10-CM | POA: Diagnosis not present

## 2022-09-23 DIAGNOSIS — L578 Other skin changes due to chronic exposure to nonionizing radiation: Secondary | ICD-10-CM | POA: Diagnosis not present

## 2022-09-23 DIAGNOSIS — L300 Nummular dermatitis: Secondary | ICD-10-CM | POA: Diagnosis not present

## 2022-09-23 DIAGNOSIS — B353 Tinea pedis: Secondary | ICD-10-CM | POA: Diagnosis not present

## 2022-09-23 DIAGNOSIS — L814 Other melanin hyperpigmentation: Secondary | ICD-10-CM | POA: Diagnosis not present

## 2022-09-23 DIAGNOSIS — Z85828 Personal history of other malignant neoplasm of skin: Secondary | ICD-10-CM | POA: Diagnosis not present

## 2022-09-23 DIAGNOSIS — L650 Telogen effluvium: Secondary | ICD-10-CM | POA: Diagnosis not present

## 2022-09-23 DIAGNOSIS — L821 Other seborrheic keratosis: Secondary | ICD-10-CM | POA: Diagnosis not present

## 2022-09-23 DIAGNOSIS — D229 Melanocytic nevi, unspecified: Secondary | ICD-10-CM | POA: Diagnosis not present

## 2022-09-23 DIAGNOSIS — L570 Actinic keratosis: Secondary | ICD-10-CM | POA: Diagnosis not present

## 2022-09-23 DIAGNOSIS — Z86006 Personal history of melanoma in-situ: Secondary | ICD-10-CM | POA: Diagnosis not present

## 2022-09-23 DIAGNOSIS — D1801 Hemangioma of skin and subcutaneous tissue: Secondary | ICD-10-CM | POA: Diagnosis not present

## 2022-09-23 DIAGNOSIS — D485 Neoplasm of uncertain behavior of skin: Secondary | ICD-10-CM | POA: Diagnosis not present

## 2022-09-23 DIAGNOSIS — D225 Melanocytic nevi of trunk: Secondary | ICD-10-CM | POA: Diagnosis not present

## 2022-10-05 DIAGNOSIS — M79604 Pain in right leg: Secondary | ICD-10-CM | POA: Diagnosis not present

## 2022-10-12 DIAGNOSIS — M79604 Pain in right leg: Secondary | ICD-10-CM | POA: Diagnosis not present

## 2022-10-28 DIAGNOSIS — M79604 Pain in right leg: Secondary | ICD-10-CM | POA: Diagnosis not present

## 2022-11-11 DIAGNOSIS — M79604 Pain in right leg: Secondary | ICD-10-CM | POA: Diagnosis not present

## 2022-11-25 DIAGNOSIS — M79604 Pain in right leg: Secondary | ICD-10-CM | POA: Diagnosis not present

## 2023-01-11 DIAGNOSIS — M79604 Pain in right leg: Secondary | ICD-10-CM | POA: Diagnosis not present

## 2023-01-27 DIAGNOSIS — M79604 Pain in right leg: Secondary | ICD-10-CM | POA: Diagnosis not present

## 2023-04-05 DIAGNOSIS — M79604 Pain in right leg: Secondary | ICD-10-CM | POA: Diagnosis not present

## 2023-04-12 DIAGNOSIS — M79604 Pain in right leg: Secondary | ICD-10-CM | POA: Diagnosis not present

## 2023-04-21 DIAGNOSIS — M79604 Pain in right leg: Secondary | ICD-10-CM | POA: Diagnosis not present

## 2023-09-04 DIAGNOSIS — H4010X1 Unspecified open-angle glaucoma, mild stage: Secondary | ICD-10-CM | POA: Diagnosis not present

## 2023-09-04 DIAGNOSIS — H401131 Primary open-angle glaucoma, bilateral, mild stage: Secondary | ICD-10-CM | POA: Diagnosis not present

## 2023-09-06 DIAGNOSIS — L814 Other melanin hyperpigmentation: Secondary | ICD-10-CM | POA: Diagnosis not present

## 2023-09-06 DIAGNOSIS — L821 Other seborrheic keratosis: Secondary | ICD-10-CM | POA: Diagnosis not present

## 2023-09-06 DIAGNOSIS — Z86006 Personal history of melanoma in-situ: Secondary | ICD-10-CM | POA: Diagnosis not present

## 2023-09-06 DIAGNOSIS — L219 Seborrheic dermatitis, unspecified: Secondary | ICD-10-CM | POA: Diagnosis not present

## 2023-09-06 DIAGNOSIS — L570 Actinic keratosis: Secondary | ICD-10-CM | POA: Diagnosis not present

## 2023-09-06 DIAGNOSIS — D1801 Hemangioma of skin and subcutaneous tissue: Secondary | ICD-10-CM | POA: Diagnosis not present

## 2023-09-06 DIAGNOSIS — Z86018 Personal history of other benign neoplasm: Secondary | ICD-10-CM | POA: Diagnosis not present

## 2023-09-06 DIAGNOSIS — L578 Other skin changes due to chronic exposure to nonionizing radiation: Secondary | ICD-10-CM | POA: Diagnosis not present

## 2023-09-06 DIAGNOSIS — Z85828 Personal history of other malignant neoplasm of skin: Secondary | ICD-10-CM | POA: Diagnosis not present

## 2023-09-06 DIAGNOSIS — D229 Melanocytic nevi, unspecified: Secondary | ICD-10-CM | POA: Diagnosis not present

## 2024-01-03 DIAGNOSIS — H53143 Visual discomfort, bilateral: Secondary | ICD-10-CM | POA: Diagnosis not present

## 2024-01-03 DIAGNOSIS — H401131 Primary open-angle glaucoma, bilateral, mild stage: Secondary | ICD-10-CM | POA: Diagnosis not present

## 2024-01-10 DIAGNOSIS — R42 Dizziness and giddiness: Secondary | ICD-10-CM | POA: Diagnosis not present
